# Patient Record
Sex: Male | Born: 2000 | Race: White | Hispanic: No | Marital: Single | Smoking: Never smoker
Health system: Southern US, Community
[De-identification: ages and names within clinical notes are randomized; demographics above are authoritative.]

---
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
## 2021-07-09 DIAGNOSIS — R42 Dizziness and giddiness: Secondary | ICD-10-CM | POA: Diagnosis not present

## 2021-07-09 DIAGNOSIS — R1084 Generalized abdominal pain: Secondary | ICD-10-CM | POA: Diagnosis not present

## 2021-07-09 DIAGNOSIS — K219 Gastro-esophageal reflux disease without esophagitis: Secondary | ICD-10-CM | POA: Diagnosis not present

## 2021-07-09 DIAGNOSIS — R109 Unspecified abdominal pain: Secondary | ICD-10-CM | POA: Diagnosis not present

## 2021-07-09 DIAGNOSIS — R079 Chest pain, unspecified: Secondary | ICD-10-CM | POA: Diagnosis not present

## 2021-07-09 DIAGNOSIS — R6881 Early satiety: Secondary | ICD-10-CM | POA: Diagnosis not present

## 2021-07-09 DIAGNOSIS — R030 Elevated blood-pressure reading, without diagnosis of hypertension: Secondary | ICD-10-CM | POA: Diagnosis not present

## 2021-07-13 DIAGNOSIS — R109 Unspecified abdominal pain: Secondary | ICD-10-CM | POA: Diagnosis not present

## 2021-07-13 DIAGNOSIS — R81 Glycosuria: Secondary | ICD-10-CM | POA: Diagnosis not present

## 2021-07-13 DIAGNOSIS — K5900 Constipation, unspecified: Secondary | ICD-10-CM | POA: Diagnosis not present

## 2021-07-17 DIAGNOSIS — K219 Gastro-esophageal reflux disease without esophagitis: Secondary | ICD-10-CM | POA: Diagnosis not present

## 2021-07-17 DIAGNOSIS — R1084 Generalized abdominal pain: Secondary | ICD-10-CM | POA: Diagnosis not present

## 2021-08-24 DIAGNOSIS — R634 Abnormal weight loss: Secondary | ICD-10-CM | POA: Diagnosis not present

## 2021-08-24 DIAGNOSIS — R110 Nausea: Secondary | ICD-10-CM | POA: Diagnosis not present

## 2021-08-24 DIAGNOSIS — R112 Nausea with vomiting, unspecified: Secondary | ICD-10-CM | POA: Diagnosis not present

## 2021-08-24 DIAGNOSIS — R1030 Lower abdominal pain, unspecified: Secondary | ICD-10-CM | POA: Diagnosis not present

## 2021-09-22 DIAGNOSIS — R634 Abnormal weight loss: Secondary | ICD-10-CM | POA: Diagnosis not present
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
## 2022-08-13 DIAGNOSIS — R10829 Rebound abdominal tenderness, unspecified site: Secondary | ICD-10-CM | POA: Diagnosis not present

## 2022-08-13 DIAGNOSIS — R1031 Right lower quadrant pain: Secondary | ICD-10-CM | POA: Diagnosis not present

## 2022-09-27 ENCOUNTER — Ambulatory Visit: Payer: Medicaid Other | Admitting: Nurse Practitioner
